# Patient Record
Sex: Male | Born: 1980 | Race: White | Hispanic: No | Marital: Single | Smoking: Current every day smoker
Health system: Southern US, Community
[De-identification: ages and names within clinical notes are randomized; demographics above are authoritative.]

## PROBLEM LIST (undated history)

## (undated) HISTORY — PX: ANTERIOR CRUCIATE LIGAMENT REPAIR: SHX115

## (undated) HISTORY — PX: TONSILLECTOMY: SUR1361

---

## 2016-04-20 ENCOUNTER — Emergency Department: Payer: Self-pay

## 2016-04-20 ENCOUNTER — Emergency Department
Admission: EM | Admit: 2016-04-20 | Discharge: 2016-04-20 | Disposition: A | Discharge status: Home / Self Care | Payer: Self-pay | Attending: Emergency Medicine | Admitting: Emergency Medicine

## 2016-04-20 ENCOUNTER — Encounter: Payer: Self-pay | Admitting: Emergency Medicine

## 2016-04-20 DIAGNOSIS — Y999 Unspecified external cause status: Secondary | ICD-10-CM | POA: Insufficient documentation

## 2016-04-20 DIAGNOSIS — Y9389 Activity, other specified: Secondary | ICD-10-CM | POA: Insufficient documentation

## 2016-04-20 DIAGNOSIS — Y92410 Unspecified street and highway as the place of occurrence of the external cause: Secondary | ICD-10-CM | POA: Insufficient documentation

## 2016-04-20 DIAGNOSIS — F17220 Nicotine dependence, chewing tobacco, uncomplicated: Secondary | ICD-10-CM | POA: Insufficient documentation

## 2016-04-20 DIAGNOSIS — S20212A Contusion of left front wall of thorax, initial encounter: Secondary | ICD-10-CM | POA: Insufficient documentation

## 2016-04-20 DIAGNOSIS — S161XXA Strain of muscle, fascia and tendon at neck level, initial encounter: Secondary | ICD-10-CM | POA: Insufficient documentation

## 2016-04-20 DIAGNOSIS — R51 Headache: Secondary | ICD-10-CM | POA: Insufficient documentation

## 2016-04-20 MED ORDER — ACETAMINOPHEN 500 MG PO TABS
1000.0000 mg | ORAL_TABLET | Freq: Once | ORAL | Status: AC
  Administered 2016-04-20: 1000 mg via ORAL
  Filled 2016-04-20: qty 2

## 2016-04-20 NOTE — Discharge Instructions (Signed)
1. You may take Tylenol and/or ibuprofen as needed for pain. 2. Return to the ER for worsening symptoms, persistent vomiting, difficulty breathing or other concerns.
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
## 2016-04-20 NOTE — ED Notes (Signed)
Patient transported to X-ray 
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
